# Patient Record
Sex: Female | Born: 1960 | Race: White | Hispanic: No | Smoking: Current every day smoker
Health system: Southern US, Community
[De-identification: ages and names within clinical notes are randomized; demographics above are authoritative.]

## PROBLEM LIST (undated history)

## (undated) DIAGNOSIS — F329 Major depressive disorder, single episode, unspecified: Secondary | ICD-10-CM

## (undated) DIAGNOSIS — A630 Anogenital (venereal) warts: Secondary | ICD-10-CM

## (undated) DIAGNOSIS — G9050 Complex regional pain syndrome I, unspecified: Secondary | ICD-10-CM

## (undated) DIAGNOSIS — F32A Depression, unspecified: Secondary | ICD-10-CM

## (undated) HISTORY — DX: Anogenital (venereal) warts: A63.0

## (undated) HISTORY — DX: Complex regional pain syndrome I, unspecified: G90.50

## (undated) HISTORY — DX: Major depressive disorder, single episode, unspecified: F32.9

## (undated) HISTORY — PX: CATARACT EXTRACTION: SUR2

## (undated) HISTORY — DX: Depression, unspecified: F32.A

---

## 2004-04-17 ENCOUNTER — Ambulatory Visit (HOSPITAL_COMMUNITY): Admission: RE | Admit: 2004-04-17 | Discharge: 2004-04-17 | Payer: Self-pay | Admitting: Family Medicine

## 2004-04-21 ENCOUNTER — Ambulatory Visit (HOSPITAL_COMMUNITY): Admission: RE | Admit: 2004-04-21 | Discharge: 2004-04-21 | Payer: Self-pay | Admitting: Family Medicine

## 2008-02-09 ENCOUNTER — Observation Stay (HOSPITAL_COMMUNITY): Admission: EM | Admit: 2008-02-09 | Discharge: 2008-02-10 | Payer: Self-pay | Admitting: Emergency Medicine

## 2008-02-09 ENCOUNTER — Ambulatory Visit: Payer: Self-pay | Admitting: Internal Medicine

## 2009-04-17 IMAGING — CT CT ANGIO CHEST
1 of 2 series · 20 of 32 positions shown · IV contrast (Omnipaque 300)
Comparison: Chest x-ray from today

CLINICAL DATA: Chest pain

CT ANGIOGRAPHY CHEST
TECHNIQUE: Multidetector CT imaging of the chest using the
standard protocol during bolus administration of intravenous
contrast. Multiplanar reconstructed images obtained and reviewed to
evaluate the vascular anatomy.
Contrast: 80 ml Jmnipaque-J33 IV

[Series 9: thin pacs · axial · 0.70mm/px · z∈[-334,-56]mm · 20 of 310 slices shown]
[im 16/310  lung]
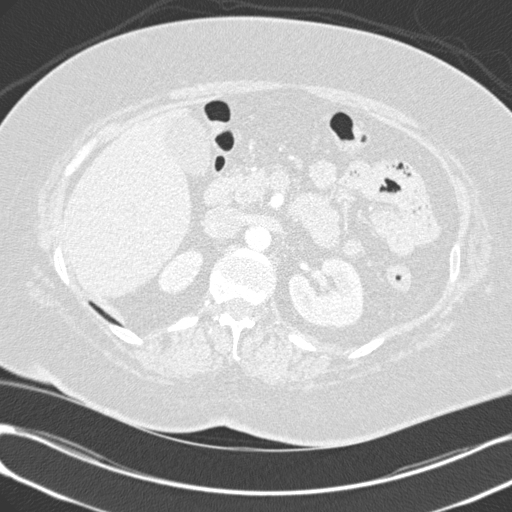
[im 31/310  mediastinal]
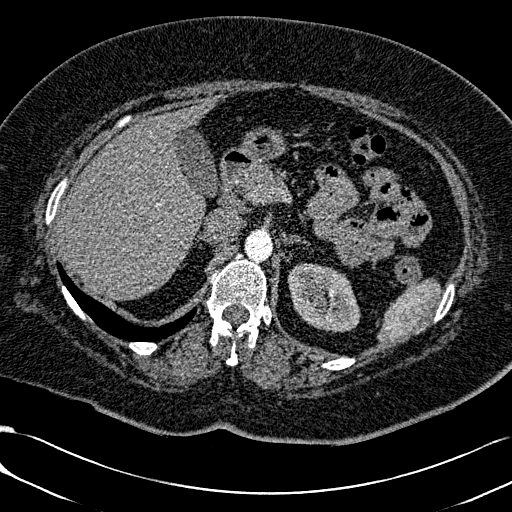
[im 47/310  lung]
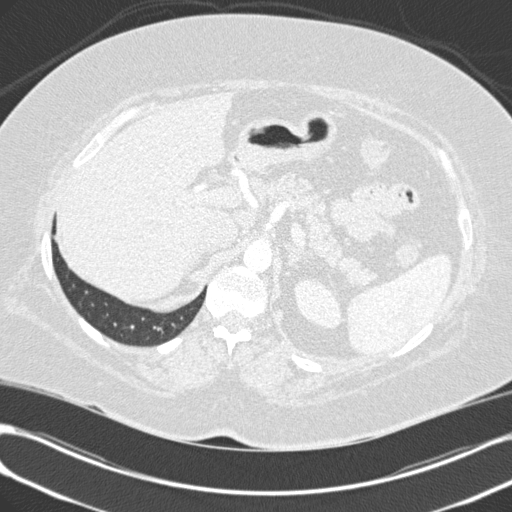
[im 62/310  mediastinal]
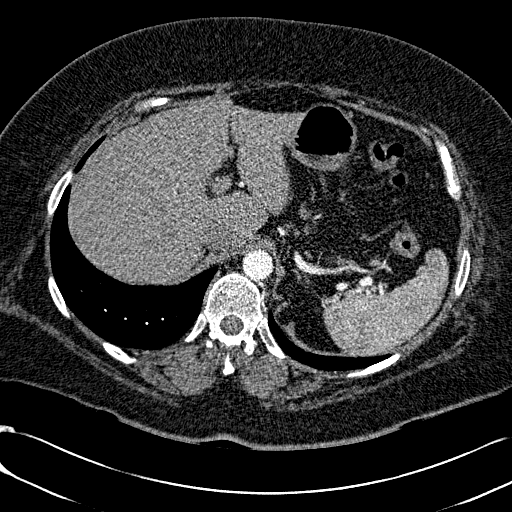
[im 78/310  lung]
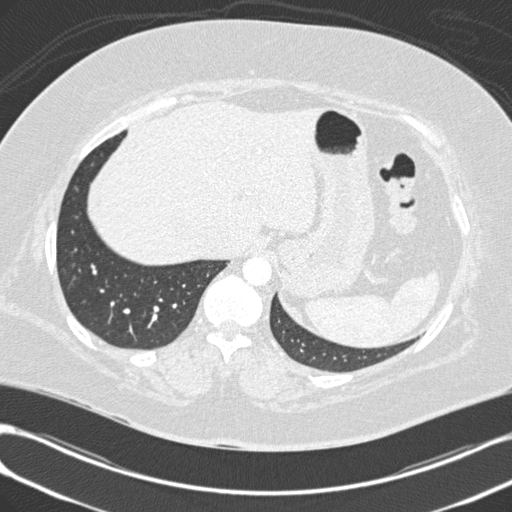
[im 104/310  mediastinal]
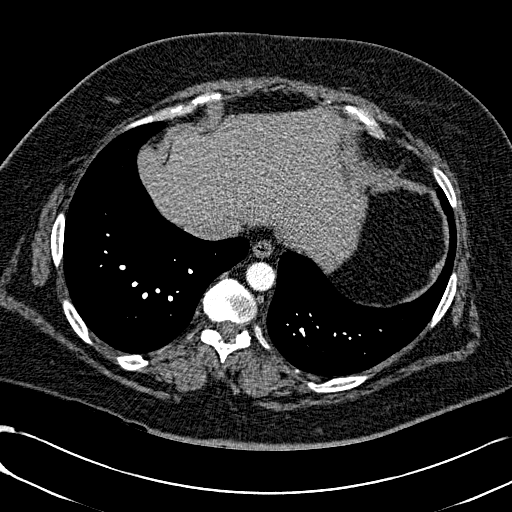
[im 109/310  lung]
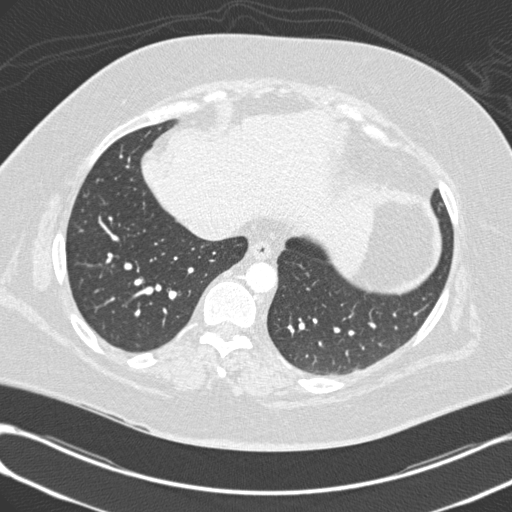
[im 124/310  mediastinal]
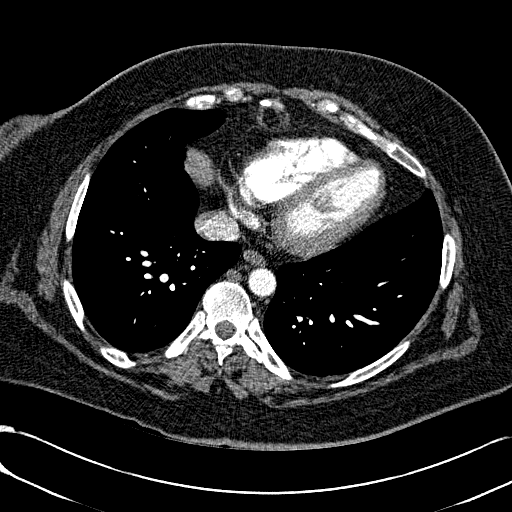
[im 140/310  lung]
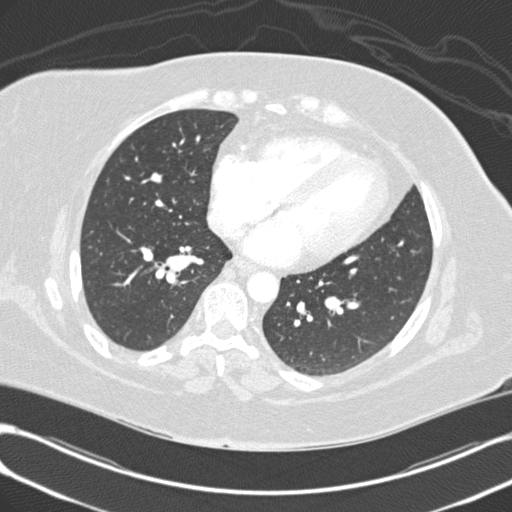
[im 146/310  mediastinal]
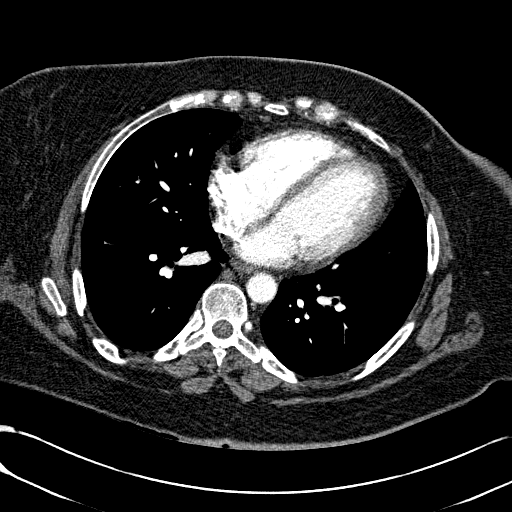
[im 155/310  lung]
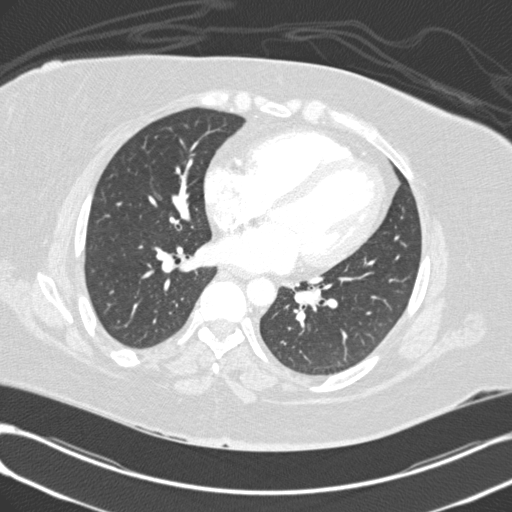
[im 170/310  mediastinal]
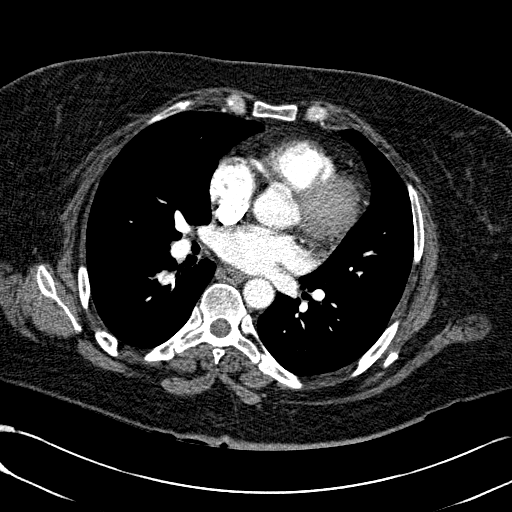
[im 186/310  lung]
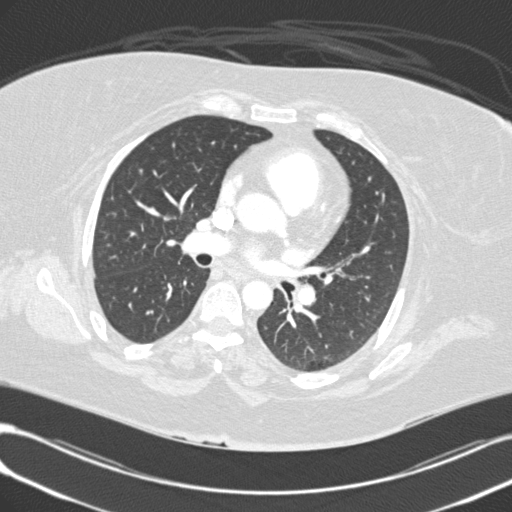
[im 201/310  mediastinal]
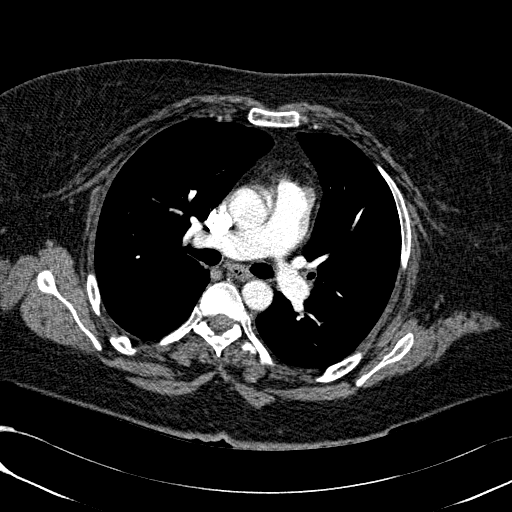
[im 207/310  lung]
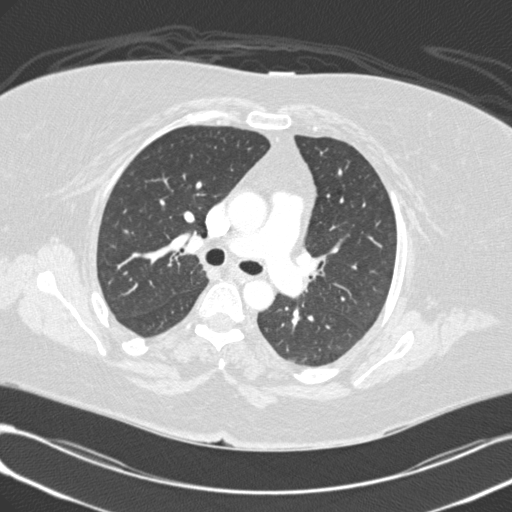
[im 232/310  mediastinal]
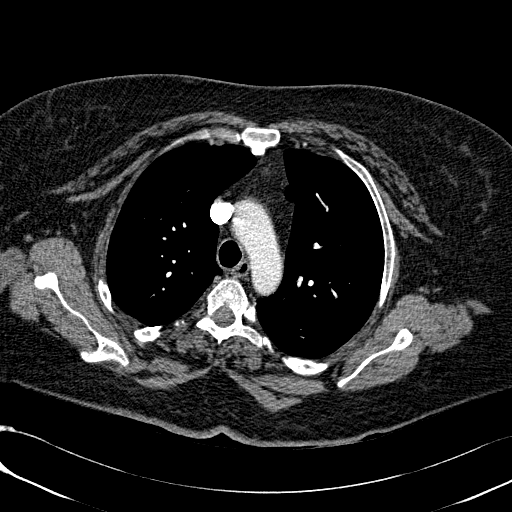
[im 248/310  lung]
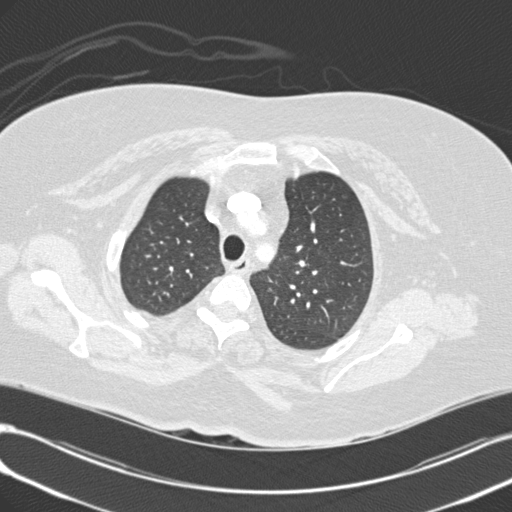
[im 263/310  mediastinal]
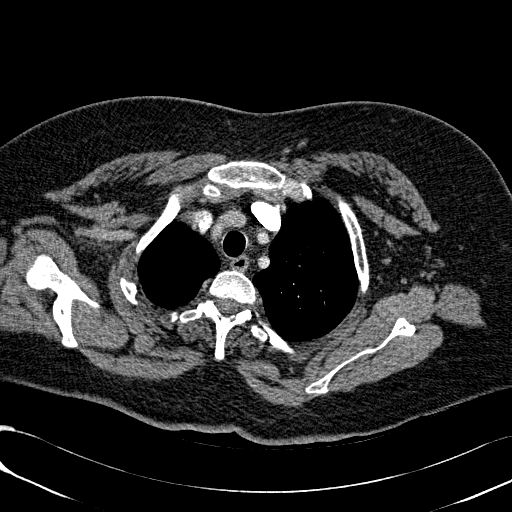
[im 279/310  lung]
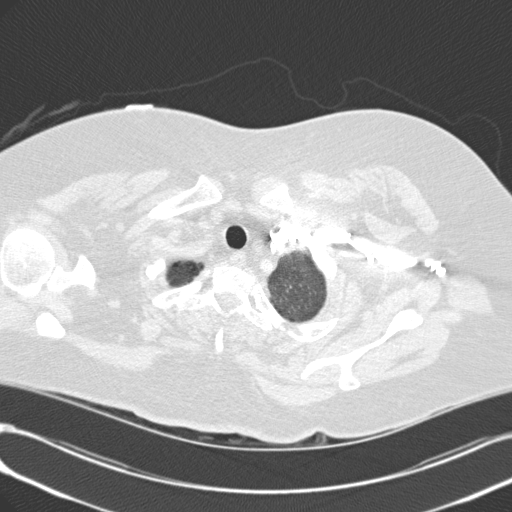
[im 294/310  mediastinal]
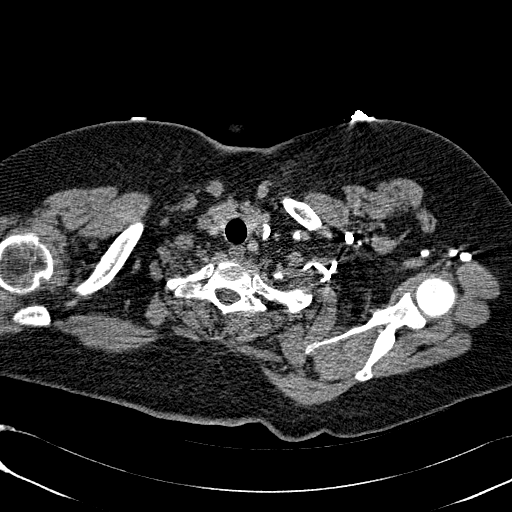

[20 of 32 positions shown; findings below may reference images not displayed]

FINDINGS: Negative for pulmonary embolism.  There is no aortic
dissection or aneurysm.  The lungs are clear there is no
infiltrate, effusion, or mass lesion.  There is no adenopathy.
IMPRESSION: Negative

## 2009-04-17 IMAGING — CR DG CHEST 1V PORT
1 series · 1 of 1 positions shown · non-contrast
Comparison: 04/21/2004

CLINICAL DATA: Chest pain

PORTABLE CHEST - 1 VIEW

[view not recorded]
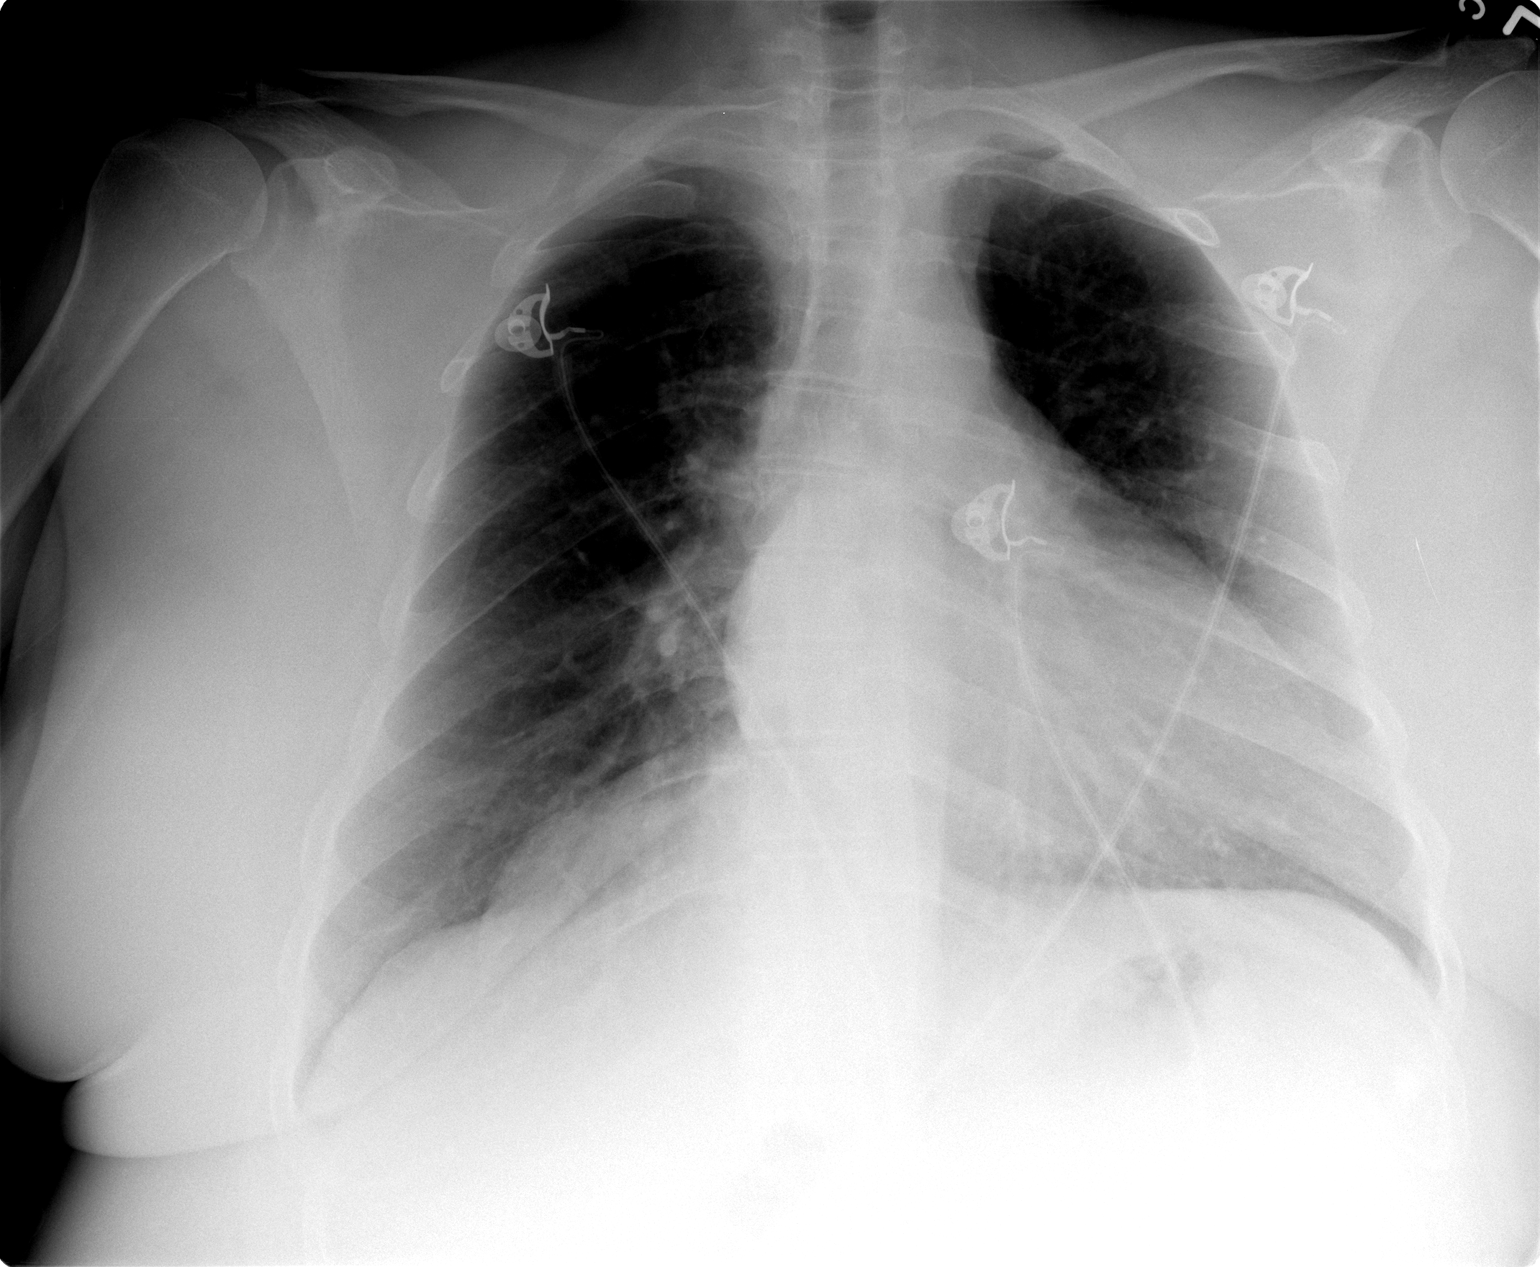

[1 of 1 positions shown; findings below may reference images not displayed]

FINDINGS: The heart is enlarged.  There is no heart failure and the
lungs are clear there is no infiltrate or edema
IMPRESSION: No active cardiopulmonary disease.

## 2011-02-06 NOTE — Group Therapy Note (Signed)
[name], MA               ACCOUNT NO.:  [id number]   MEDICAL RECORD NO.:  [id number]          PATIENT TYPE:  OBV   LOCATION:  A319                          FACILITY:  [facility]   PHYSICIAN:  Angus G. Renard Matter, MD   DATE OF BIRTH:  [birth year]   DATE OF PROCEDURE:  DATE OF DISCHARGE:  02/10/2008                                 PROGRESS NOTE   This patient was admitted to the hospital with chest pain.  She  presented to the ED with acute symptoms of chest pain over lower  anterior chest and in back.  She had workup to include CT angio of the  chest to rule out pulmonary embolization as well as venous Doppler  ultrasound.  Her cardiac markers, most recent CPK total 538, CPK-MB 1.6,  relative index 0.3, and troponin 0.01.  The patient continues to have  some chest pain.  She is being seen by Cardiology as well.   OBJECTIVE:  VITAL SIGNS:  Blood pressure 98/52, respiration 20, pulse  60, and temp 98.2.  LUNGS:  Clear to P&A.  HEART:  Regular rhythm, sinus bradycardia.  ABDOMEN:  No palpable organs or masses.   ASSESSMENT:  The Patient was admitted with anterior chest pain, which is  felt also posteriorly.  She was admitted to rule out coronary artery  disease and still having some pain.   PLAN:  Continue p.r.n. pain medication.  Continue workup to include  amylase, lipase, and CMET.  We will obtain x-ray of thoracic lumbar  spine.      Angus G. Renard Matter, MD  Electronically Signed     AGM/MEDQ  D:  02/10/2008  T:  02/10/2008  Job:  [id number]

## 2011-02-06 NOTE — Consult Note (Signed)
NAMEMORGHAN, Elizabeth Mercado NO.:  [id number]   MEDICAL RECORD NO.:  [id number]          PATIENT TYPE:  INP   LOCATION:  A319                          FACILITY:  [facility]   PHYSICIAN:  Pricilla Riffle, MD, FACCDATE OF BIRTH:  12-23-1960   DATE OF CONSULTATION:  DATE OF DISCHARGE:                                 CONSULTATION   IDENTIFICATION:  The patient is a 50 year old who we are asked to see  regarding chest pain.   HISTORY OF PRESENT ILLNESS:  The patient has no known history of cardiac  problems.  She does have a history of tobacco use since her teens.  Today, she was at her house when she went to the bathroom developed  severe mid chest discomfort like a cramping sensation sharp, lasted a  few seconds and eased.  She said she got short of breath, anxious, went  into the living room still short of breath again tightening, again this  eased.  She has been left with a residual tightness along her midsternal  to the right costophrenic line.   She got concerned and presented to the ER for further evaluation.  Here  she continues to complain of the right-sided chest tightness.  It is  worse with inspiration and movement.  Also notes some radiation to the  back.   Of note, the patient has been noted she was gardening last week had some  leg soreness only.  Denies any other episodes of chest pain.  No  significant shortness of breath until today.   ALLERGIES:  CODEINE.   PAST MEDICAL HISTORY:  Negative.   SOCIAL HISTORY:  The patient is married with children, has a tobacco  history one pack per day since age 33.   FAMILY HISTORY:  Significant for mother with hypertension.  Father with  diabetes.  No known history of premature CAD.   REVIEW OF SYSTEMS:  Denies fevers, chills, no productive cough, notes  occasional indigestion, but no reflux.  Otherwise all systems reviewed  negative to the above problem except as noted above.   PHYSICAL EXAMINATION:  GENERAL:  On  exam, the patient currently is in no  acute distress.  Does note some mild tightness along the right  costophrenic line.  VITAL SIGNS:  Blood pressure 132/74, pulse is 70 and regular,  temperature is 98.7, O2 sat on 2 liters was 100%.  HEENT:  Normocephalic, atraumatic.  EOMI, PERRL.  Mucous membranes are  moist.  NECK:  JVP is normal.  No bruits.  No thyromegaly.  LUNGS:  Clear.  No rales or wheezes.  CARDIAC:  Regular rate and rhythm.  S1, S2.  No S3, S4 murmurs.  CHEST:  Tender along the mid sternal along the right costophrenic line  reproduces the pain.  BACK:  Also tender in the right scapular area.  ABDOMEN:  Supple.  No right upper quadrant tenderness.  Normal bowel  sounds.  No masses.  EXTREMITIES:  Good distal pulses throughout.  No lower extremity  swelling.  2+ pulses.  NEURO:  Alert and oriented x3.  Cranial nerves II through  XII intact.  Moving all extremities.  Motor 5 through 5.   A 12-lead EKG shows normal sinus rhythm.  No acute ST changes.  Rate of  77 beats per minute.   LABORATORY DATA:  Significant for hemoglobin of 14, WBC of 8.1,  platelets of 321,000, BUN and creatinine of 8 and 0.7, and potassium of  3.7.  Initial CK-MB and troponin are negative.  D-dimer slightly  increased to 0.64.  lower extremity ultrasound is negative for DVT.  Chest x-ray portable shows no acute disease.   IMPRESSION:  The patient is a 50 year old with only history significant  for tobacco use.  She says her cholesterol has been good.  I doubt the  current pain is cardiac.  It appears to be more musculoskeletal or  question GI.  I agree with pain med trial.  We will check the CK  troponin, continue telemetry.  Check LFTs, amylase, lipase.  Check  lipids in morning.  Counseled on tobacco.  Based on the above, I would  not pursue further cardiac testing unless situation changes.      Pricilla Riffle, MD, [facility]  Electronically Signed     PVR/MEDQ  D:  02/09/2008  T:  02/10/2008   Job:  [id number]

## 2011-02-06 NOTE — H&P (Signed)
NAMEMYRISSA, CHIPLEY               ACCOUNT NO.:  [id number]   MEDICAL RECORD NO.:  [id number]          PATIENT TYPE:  INP   LOCATION:  A319                          FACILITY:  [facility]   PHYSICIAN:  Angus G. Renard Matter, MD   DATE OF BIRTH:  [birth year]   DATE OF ADMISSION:  02/09/2008  DATE OF DISCHARGE:  LH                              HISTORY & PHYSICAL   HISTORY OF PRESENT ILLNESS:  A 50 year old female developed acutely this  morning chest pain, which she describes sharp pain in upper epigastric  and lower chest area, which radiated to both sides.  This was  accompanied by some shortness of breath persisted over a period of  several hours.  She subsequently was seen by the emergency room  physician.  Evaluation there consisted of portable chest x-ray, which  showed enlargement of her heart, but no active cardiopulmonary disease.  The patient did have also a CT angio of chest, which was negative.  A  venous Doppler ultrasound did not show evidence of DVT.  The patient's  lab studies, total CK was 689 with CK-MB 2.3, troponin 0.01, and  relative index 0.3.  BNP essentially normal.  The patient was admitted  to rule out coronary artery disease.   SOCIAL HISTORY:  The patient drinks occasionally, but does not smoke.   FAMILY HISTORY:  See previous record.   PAST MEDICAL HISTORY:  The patient has not had no particular medical  problems.   PAST SURGICAL HISTORY:  C-section.   ALLERGIES:  No drug allergies except intolerance to CODEINE.   MEDICATIONS:  No medications.   REVIEW OF SYSTEMS:  HEENT:  Negative.  CARDIOPULMONARY:  The patient has  had no cough, some slight dyspnea, chest pain in lower chest.  LUNGS:  No other abnormality.  GI:  No bowel irregularity.  No bleeding.  No  nausea, vomiting, or diarrhea.  GU:  No dysuria or hematuria.   PHYSICAL EXAMINATION:  VITAL SIGNS:  Slightly overweight white female  with blood pressure of 108/54, respirations 20, pulse 54, and  temperature 97.1.  HEENT:  Eyes, PERRLA.  TMs negative.  Oropharynx benign.  NECK:  Supple.  No JVD or thyroid abnormalities.  BREASTS:  No masses.  HEART:  Regular rhythm.  No murmurs.  LUNGS:  Clear to P&A.  Slight tenderness over the lower chest wall.  ABDOMEN:  No palpable organs or masses.  No organomegaly.  EXTREMITIES:  Free of edema.  NEUROLOGIC:  No focal deficits.   ASSESSMENT:  The patient was admitted to the hospital with chest pain to  rule out coronary artery disease and evaluate for other causes.  Pain  was moderately severe.  Plan to run a series of cardiac enzymes, control  the pain, obtain cardiology consult for further evaluation.      Angus G. Renard Matter, MD  Electronically Signed     AGM/MEDQ  D:  02/09/2008  T:  02/10/2008  Job:  [id number]

## 2011-06-20 LAB — CBC
HCT: 40.7
MCHC: 35.3
MCV: 91.4
RBC: 4.45
RDW: 12.6

## 2011-06-20 LAB — DIFFERENTIAL
Basophils Absolute: 0
Basophils Relative: 1
Lymphocytes Relative: 31

## 2011-06-20 LAB — BASIC METABOLIC PANEL
BUN: 8
CO2: 28
Chloride: 106
Creatinine, Ser: 0.74
GFR calc Af Amer: >60
Potassium: 3.7

## 2011-06-20 LAB — COMPREHENSIVE METABOLIC PANEL
ALT: 15
Albumin: 2.9 — ABNORMAL LOW
Alkaline Phosphatase: 45
BUN: 10
CO2: 28
Calcium: 8.2 — ABNORMAL LOW
GFR calc non Af Amer: >60
Potassium: 4.2
Total Protein: 5.1 — ABNORMAL LOW

## 2011-06-20 LAB — CARDIAC PANEL(CRET KIN+CKTOT+MB+TROPI)
CK, MB: 2.3
Relative Index: 0.3
Total CK: 413 — ABNORMAL HIGH
Total CK: 538 — ABNORMAL HIGH
Total CK: 689 — ABNORMAL HIGH
Troponin I: <0.01

## 2011-06-20 LAB — POCT CARDIAC MARKERS
CKMB, poc: 1.4
Troponin i, poc: <0.05

## 2011-06-20 LAB — LIPID PANEL
Cholesterol: 129
HDL: 35 — ABNORMAL LOW
Triglycerides: 77

## 2011-06-20 LAB — D-DIMER, QUANTITATIVE: D-Dimer, Quant: 0.64 — ABNORMAL HIGH

## 2014-08-30 ENCOUNTER — Other Ambulatory Visit (HOSPITAL_COMMUNITY): Payer: Self-pay | Admitting: Family Medicine

## 2014-08-30 ENCOUNTER — Ambulatory Visit (HOSPITAL_COMMUNITY)
Admission: RE | Admit: 2014-08-30 | Discharge: 2014-08-30 | Disposition: A | Discharge status: Home / Self Care | Payer: BC Managed Care – PPO | Source: Ambulatory Visit | Attending: Family Medicine | Admitting: Family Medicine

## 2014-08-30 DIAGNOSIS — R079 Chest pain, unspecified: Secondary | ICD-10-CM

## 2014-08-30 DIAGNOSIS — J984 Other disorders of lung: Secondary | ICD-10-CM | POA: Insufficient documentation

## 2014-08-30 DIAGNOSIS — R05 Cough: Secondary | ICD-10-CM | POA: Insufficient documentation

## 2014-08-31 ENCOUNTER — Other Ambulatory Visit (HOSPITAL_COMMUNITY): Payer: Self-pay | Admitting: Family Medicine

## 2014-08-31 ENCOUNTER — Ambulatory Visit (HOSPITAL_COMMUNITY)
Admission: RE | Admit: 2014-08-31 | Discharge: 2014-08-31 | Disposition: A | Discharge status: Home / Self Care | Payer: BC Managed Care – PPO | Source: Ambulatory Visit | Attending: Family Medicine | Admitting: Family Medicine

## 2014-08-31 DIAGNOSIS — L905 Scar conditions and fibrosis of skin: Secondary | ICD-10-CM

## 2014-08-31 DIAGNOSIS — R079 Chest pain, unspecified: Secondary | ICD-10-CM | POA: Insufficient documentation

## 2014-09-01 ENCOUNTER — Other Ambulatory Visit (HOSPITAL_COMMUNITY): Payer: Self-pay | Admitting: Family Medicine

## 2014-09-01 ENCOUNTER — Ambulatory Visit (HOSPITAL_COMMUNITY)
Admission: RE | Admit: 2014-09-01 | Discharge: 2014-09-01 | Disposition: A | Discharge status: Home / Self Care | Payer: BC Managed Care – PPO | Source: Ambulatory Visit | Attending: Family Medicine | Admitting: Family Medicine

## 2014-09-01 DIAGNOSIS — R079 Chest pain, unspecified: Secondary | ICD-10-CM

## 2014-09-01 DIAGNOSIS — J189 Pneumonia, unspecified organism: Secondary | ICD-10-CM | POA: Diagnosis not present

## 2014-09-01 MED ORDER — IOHEXOL 300 MG/ML  SOLN
80.0000 mL | Freq: Once | INTRAMUSCULAR | Status: AC | PRN
  Administered 2014-09-01: 80 mL via INTRAVENOUS

## 2014-09-01 MED ORDER — SODIUM CHLORIDE 0.9 % IJ SOLN
INTRAMUSCULAR | Status: AC
  Filled 2014-09-01: qty 60

## 2014-09-01 MED ORDER — SODIUM CHLORIDE 0.9 % IJ SOLN
INTRAMUSCULAR | Status: AC
  Filled 2014-09-01: qty 600

## 2014-09-06 ENCOUNTER — Ambulatory Visit (HOSPITAL_COMMUNITY)
Admission: RE | Admit: 2014-09-06 | Discharge: 2014-09-06 | Disposition: A | Discharge status: Home / Self Care | Payer: BC Managed Care – PPO | Source: Ambulatory Visit | Attending: Family Medicine | Admitting: Family Medicine

## 2014-09-06 ENCOUNTER — Other Ambulatory Visit (HOSPITAL_COMMUNITY): Payer: Self-pay | Admitting: Family Medicine

## 2014-09-06 DIAGNOSIS — F17210 Nicotine dependence, cigarettes, uncomplicated: Secondary | ICD-10-CM

## 2014-09-06 DIAGNOSIS — J189 Pneumonia, unspecified organism: Secondary | ICD-10-CM | POA: Insufficient documentation

## 2015-07-12 ENCOUNTER — Other Ambulatory Visit (HOSPITAL_COMMUNITY): Payer: Self-pay | Admitting: Family Medicine

## 2015-07-12 DIAGNOSIS — Z1231 Encounter for screening mammogram for malignant neoplasm of breast: Secondary | ICD-10-CM

## 2015-07-15 ENCOUNTER — Ambulatory Visit (HOSPITAL_COMMUNITY)
Admission: RE | Admit: 2015-07-15 | Discharge: 2015-07-15 | Disposition: A | Discharge status: Home / Self Care | Payer: BC Managed Care – PPO | Source: Ambulatory Visit | Attending: Family Medicine | Admitting: Family Medicine

## 2015-07-15 DIAGNOSIS — Z1231 Encounter for screening mammogram for malignant neoplasm of breast: Secondary | ICD-10-CM

## 2015-07-18 ENCOUNTER — Ambulatory Visit (HOSPITAL_COMMUNITY): Payer: BC Managed Care – PPO

## 2018-11-24 ENCOUNTER — Encounter: Payer: Self-pay | Admitting: Gastroenterology

## 2018-12-24 ENCOUNTER — Other Ambulatory Visit: Payer: Self-pay | Admitting: Women's Health

## 2019-01-29 ENCOUNTER — Ambulatory Visit (INDEPENDENT_AMBULATORY_CARE_PROVIDER_SITE_OTHER): Payer: BC Managed Care – PPO | Admitting: Gastroenterology

## 2019-01-29 ENCOUNTER — Other Ambulatory Visit: Payer: Self-pay

## 2019-01-29 ENCOUNTER — Encounter: Payer: Self-pay | Admitting: Gastroenterology

## 2019-01-29 DIAGNOSIS — Z1211 Encounter for screening for malignant neoplasm of colon: Secondary | ICD-10-CM | POA: Insufficient documentation

## 2019-01-29 NOTE — Patient Instructions (Signed)
We are scheduling you for a colonoscopy with Dr. Oneida Alar in the near future.  Continue to eat high fiber foods, and you can add Benefiber or Metamucil daily to your regimen to ensure you are getting adequate fiber intake. Avoid straining or more than a few minutes on the toilet at a time.   It was a pleasure to see you today. I strive to create trusting relationships with patients to provide genuine, compassionate, and quality care. I value your feedback. If you receive a survey regarding your visit,  I greatly appreciate you taking time to fill this out.   Annitta Needs, PhD, ANP-BC [facility]

## 2019-01-29 NOTE — Progress Notes (Signed)
Referring Provider: Dr. Maudie Mercury  Primary Care Physician:  Jani Gravel, MD  Primary GI: Dr. Thurston Hole Visit via Telephone Note Due to COVID-19, visit is conducted virtually and was requested by patient.   I connected with Elizabeth Mercado on 01/29/19 at  8:30 AM EDT by telephone and verified that I am speaking with the correct person using two identifiers.   I discussed the limitations, risks, security and privacy concerns of performing an evaluation and management service by telephone and the availability of in person appointments. I also discussed with the patient that there may be a patient responsible charge related to this service. The patient expressed understanding and agreed to proceed.  Chief Complaint  Patient presents with  . Colonoscopy    Hemorrhoids     History of Present Illness: 58 year old female with need for initial screening colonoscopy, referred by Dr. Maudie Mercury. Chronic history of hemorrhoids since childbirth, 32 years ago. 2 "stick out" next to vagina. Possible tags. Only with constipation has flares. As long as eating veggies, will have regular BMs. Will use preparation H if needed. Sometimes has to wipe a lot to get clean. Rare scant paper hematochezia with constipation. No abdominal pain. No N/V. No GERD. No dysphagia. Good appetite. Lost about 40 lbs and working with [facility] with phentermine.   Past Medical History:  Diagnosis Date  . CRPS (complex regional pain syndrome type I)   . Depression      Past Surgical History:  Procedure Laterality Date  . CATARACT EXTRACTION    . CESAREAN SECTION       Current Meds  Medication Sig  . DULoxetine (CYMBALTA) 60 MG capsule Take 60 mg by mouth 2 (two) times a day.  . ergocalciferol (VITAMIN D2) 1.25 MG (50000 UT) capsule Take 50,000 Units by mouth once a week.  . gabapentin (NEURONTIN) 100 MG capsule Take 100 mg by mouth 2 (two) times a day.  . phentermine 37.5 MG capsule Take 37.5 mg by mouth every  morning.    Family History  Problem Relation Age of Onset  . Colon polyps Mother        in her 15s, surveillance every 5 years  . Colon cancer Neg Hx     Social History   Socioeconomic History  . Marital status: Divorced    Spouse name: Not on file  . Number of children: Not on file  . Years of education: Not on file  . 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 Financial resource strain: Not on file  . Food insecurity:    Worry: Not on file    Inability: Not on file  . Transportation needs:    Medical: Not on file    Non-medical: Not on file  Tobacco Use  . Smoking status: Current Every Day Smoker    Packs/day: 0.75  . Smokeless tobacco: Never Used  Substance and Sexual Activity  . Alcohol use: Yes    Alcohol/week: 7.0 standard drinks    Types: 7 Standard drinks or equivalent per week    Comment: Mixed drink daily, in the evenings one.   . Drug use: Never  . Sexual activity: Not on file  Lifestyle  . Physical activity:    Days per week: Not on file    Minutes per session: Not on file  . Stress: Not on file  Relationships  . Social  connections:    Talks on phone: Not on file    Gets together: Not on file    Attends religious service: Not on file    Active member of club or organization: Not on file    Attends meetings of clubs or organizations: Not on file    Relationship status: Not on file  . Intimate partner violence:    Fear of current or ex partner: Not on file    Emotionally abused: Not on file    Physically abused: Not on file    Forced sexual activity: Not on file  Other Topics Concern  . Not on file  Social History Narrative  . Not on file      Review of Systems: Gen: see HPI CV: Denies chest pain, palpitations, syncope, peripheral edema, and claudication. Resp: "smoker's cough" GI: see HPI Derm: Denies rash, itching, dry skin Psych: Depression  stable with Celexa  Heme: Denies bruising, bleeding, and enlarged lymph nodes.  Observations/Objective: No distress. Pleasant and cooperative. Maintains eye contact, answers appropriately.   Assessment and Plan: 58 year old female with need for initial screening colonoscopy. From report, appears to have possible internal hemorrhoids and the exterior likely with hemorrhoid tags. No family history of colorectal cancer, but mom with polyps in her 28s, with 5 year surveillances planned.   Proceed with colonoscopy with Dr. Oneida Alar in the near future. The risks, benefits, and alternatives have been discussed in detail with the patient. They state understanding and desire to proceed.  Propofol due to history of ETOH and medications Possible banding if appropriate for internal hemorrhoids   Follow Up Instructions:    I discussed the assessment and treatment plan with the patient. The patient was provided an opportunity to ask questions and all were answered. The patient agreed with the plan and demonstrated an understanding of the instructions.   The patient was advised to call back or seek an in-person evaluation if the symptoms worsen or if the condition fails to improve as anticipated.  I provided minutes of 15 face-to-face time during this encounter.  Annitta Needs, PhD, ANP-BC [facility]

## 2019-01-29 NOTE — Progress Notes (Signed)
CC'D TO PCP °

## 2019-02-09 ENCOUNTER — Other Ambulatory Visit: Payer: BC Managed Care – PPO | Admitting: Women's Health

## 2019-02-23 ENCOUNTER — Other Ambulatory Visit: Payer: Self-pay | Admitting: *Deleted

## 2019-02-23 ENCOUNTER — Telehealth: Payer: Self-pay | Admitting: *Deleted

## 2019-02-23 DIAGNOSIS — Z1211 Encounter for screening for malignant neoplasm of colon: Secondary | ICD-10-CM

## 2019-02-23 DIAGNOSIS — K649 Unspecified hemorrhoids: Secondary | ICD-10-CM

## 2019-02-23 MED ORDER — PEG 3350-KCL-NA BICARB-NACL 420 G PO SOLR: 4000.0000 mL | Freq: Once | ORAL | 0 refills | Status: AC

## 2019-02-23 NOTE — Telephone Encounter (Signed)
Called patient. She is scheduled for TCS +/-hem banding with propofol with [facility] on 8/25 at 1:15pm. Patient aware will send rx to pharmacy. Instructions with pre-op appt will be mailed (confirmed address). Orders entered.

## 2019-02-24 NOTE — Telephone Encounter (Signed)
Pre-op appt mailed with instructions 

## 2019-02-27 ENCOUNTER — Telehealth: Payer: Self-pay | Admitting: *Deleted

## 2019-02-27 NOTE — Telephone Encounter (Signed)
Patient informed we are still not allowing any visitors or children to come in during appointment time unless physical assistance is needed. Asked if has had any exposure to anyone suspected or confirmed of having COVID-19 or if she was experiencing any of the following, to reschedule: fever, cough, shortness of breath, muscle pain, diarrhea, rash, vomiting, abdominal pain, red eye, weakness, bruising, bleeding, joint pain, or a severe headache.  Stated no to all.  Advised to call our office on arrival in our office parking lot to complete registration over the phone. Advised to also use the provided hand sanitizer when entering the office and to wear a mask if she has one, if not, we will provide one. Pt verbalized understanding.

## 2019-03-02 ENCOUNTER — Other Ambulatory Visit: Payer: Self-pay

## 2019-03-02 ENCOUNTER — Encounter: Payer: Self-pay | Admitting: Adult Health

## 2019-03-02 ENCOUNTER — Other Ambulatory Visit (HOSPITAL_COMMUNITY)
Admission: RE | Admit: 2019-03-02 | Discharge: 2019-03-02 | Disposition: A | Discharge status: Home / Self Care | Payer: BC Managed Care – PPO | Source: Ambulatory Visit | Attending: Adult Health | Admitting: Adult Health

## 2019-03-02 ENCOUNTER — Ambulatory Visit (INDEPENDENT_AMBULATORY_CARE_PROVIDER_SITE_OTHER): Payer: BC Managed Care – PPO | Admitting: Adult Health

## 2019-03-02 VITALS — BP 125/74 | HR 74 | Ht 65.0 in | Wt 235.4 lb

## 2019-03-02 DIAGNOSIS — K649 Unspecified hemorrhoids: Secondary | ICD-10-CM | POA: Diagnosis not present

## 2019-03-02 DIAGNOSIS — Z01419 Encounter for gynecological examination (general) (routine) without abnormal findings: Secondary | ICD-10-CM

## 2019-03-02 DIAGNOSIS — Z124 Encounter for screening for malignant neoplasm of cervix: Secondary | ICD-10-CM

## 2019-03-02 DIAGNOSIS — Z1211 Encounter for screening for malignant neoplasm of colon: Secondary | ICD-10-CM | POA: Diagnosis not present

## 2019-03-02 DIAGNOSIS — Z1212 Encounter for screening for malignant neoplasm of rectum: Secondary | ICD-10-CM | POA: Diagnosis not present

## 2019-03-02 LAB — HEMOCCULT GUIAC POC 1CARD (OFFICE): Fecal Occult Blood, POC: NEGATIVE

## 2019-03-02 NOTE — Progress Notes (Signed)
Patient ID: Elizabeth Mercado, female   DOB: 01-02-1961, 58 y.o.   MRN: [id number] History of Present Illness: Shilpa is a 58 year old white female, divorced, PM in for a pap and pelvic exam.She has physical with PCP. PCP is Dr Maudie Mercury.    Current Medications, Allergies, Past Medical History, Past Surgical History, Family History and Social History were reviewed in [employer] record.     Review of Systems: Patient denies any problems with urination, if eats greens every day, no constipation, and is not sexually active in over a year.    Physical Exam:BP 125/74 (BP Location: Left Arm, Patient Position: Sitting, Cuff Size: Normal)   Pulse 74   Ht 5\' 5"  (1.651 m)   Wt 235 lb 6.4 oz (106.8 kg)   BMI 39.17 kg/m  General:  Well developed, well nourished, no acute distress Skin:  Warm and dry Lungs; Clear to auscultation bilaterally Cardiovascular: Regular rate and rhythm Pelvic:  External genitalia is normal in appearance, no lesions.  The vagina is pale with loss of color, moisture and rugae. Urethra has no lesions or masses. The cervix is smooth,pap with HPV performed.  Uterus is felt to be normal size, shape, and contour.  No adnexal masses or tenderness noted.Bladder is non tender, no masses felt. Rectal: Good sphincter tone, no polyps, +internal and external hemorrhoids felt.  Hemoccult negative. Psych:  No mood changes, alert and cooperative,seems happy Fall risk is low. PHQ 2 score 0. Examination chaperoned by Estill Bamberg Rash LPN.  Impression: 1. Encounter for gynecological examination with Papanicolaou smear of cervix   2. Screening for colorectal cancer   3. Hemorrhoids, unspecified hemorrhoid type       Plan: Pap with HPV sent Pap in 3 years Physical and labs with PCP  Had mammogram in October at [facility] mobile unit, was normal she says Colonoscopy scheduled for august 15 with Dr Oneida Alar

## 2019-03-05 LAB — CYTOLOGY - PAP
Adequacy: ABSENT
Diagnosis: NEGATIVE
HPV: NOT DETECTED

## 2019-05-14 ENCOUNTER — Telehealth: Payer: Self-pay | Admitting: *Deleted

## 2019-05-14 ENCOUNTER — Other Ambulatory Visit: Payer: Self-pay

## 2019-05-14 ENCOUNTER — Encounter (HOSPITAL_COMMUNITY): Payer: Self-pay

## 2019-05-14 NOTE — Telephone Encounter (Signed)
Called patient. She did not want to move up procedure time on 05/19/2019. Called endo and made aware

## 2019-05-15 ENCOUNTER — Encounter (HOSPITAL_COMMUNITY)
Admission: RE | Admit: 2019-05-15 | Discharge: 2019-05-15 | Disposition: A | Discharge status: Home / Self Care | Payer: BC Managed Care – PPO | Source: Ambulatory Visit | Attending: Gastroenterology | Admitting: Gastroenterology

## 2019-05-15 ENCOUNTER — Other Ambulatory Visit: Payer: Self-pay

## 2019-05-15 ENCOUNTER — Other Ambulatory Visit (HOSPITAL_COMMUNITY)
Admission: RE | Admit: 2019-05-15 | Discharge: 2019-05-15 | Disposition: A | Discharge status: Home / Self Care | Payer: BC Managed Care – PPO | Source: Ambulatory Visit | Attending: Gastroenterology | Admitting: Gastroenterology

## 2019-05-15 DIAGNOSIS — Z20828 Contact with and (suspected) exposure to other viral communicable diseases: Secondary | ICD-10-CM | POA: Insufficient documentation

## 2019-05-15 DIAGNOSIS — Z01812 Encounter for preprocedural laboratory examination: Secondary | ICD-10-CM | POA: Diagnosis not present

## 2019-05-15 LAB — SARS CORONAVIRUS 2 (TAT 6-24 HRS): SARS Coronavirus 2: NEGATIVE

## 2019-05-19 ENCOUNTER — Encounter (HOSPITAL_COMMUNITY): Payer: Self-pay | Admitting: *Deleted

## 2019-05-19 ENCOUNTER — Ambulatory Visit (HOSPITAL_COMMUNITY): Payer: BC Managed Care – PPO | Admitting: Anesthesiology

## 2019-05-19 ENCOUNTER — Encounter (HOSPITAL_COMMUNITY)
Admission: RE | Disposition: A | Discharge status: Home / Self Care | Payer: Self-pay | Source: Home / Self Care | Attending: Gastroenterology

## 2019-05-19 ENCOUNTER — Ambulatory Visit (HOSPITAL_COMMUNITY)
Admission: RE | Admit: 2019-05-19 | Discharge: 2019-05-19 | Disposition: A | Discharge status: Home / Self Care | Payer: BC Managed Care – PPO | Attending: Gastroenterology | Admitting: Gastroenterology

## 2019-05-19 ENCOUNTER — Other Ambulatory Visit: Payer: Self-pay

## 2019-05-19 DIAGNOSIS — Z79899 Other long term (current) drug therapy: Secondary | ICD-10-CM | POA: Diagnosis not present

## 2019-05-19 DIAGNOSIS — Z1211 Encounter for screening for malignant neoplasm of colon: Secondary | ICD-10-CM | POA: Diagnosis not present

## 2019-05-19 DIAGNOSIS — K635 Polyp of colon: Secondary | ICD-10-CM | POA: Diagnosis not present

## 2019-05-19 DIAGNOSIS — D123 Benign neoplasm of transverse colon: Secondary | ICD-10-CM | POA: Diagnosis not present

## 2019-05-19 DIAGNOSIS — K648 Other hemorrhoids: Secondary | ICD-10-CM | POA: Diagnosis not present

## 2019-05-19 DIAGNOSIS — F329 Major depressive disorder, single episode, unspecified: Secondary | ICD-10-CM | POA: Diagnosis not present

## 2019-05-19 DIAGNOSIS — K649 Unspecified hemorrhoids: Secondary | ICD-10-CM

## 2019-05-19 DIAGNOSIS — Z885 Allergy status to narcotic agent status: Secondary | ICD-10-CM | POA: Insufficient documentation

## 2019-05-19 DIAGNOSIS — K5730 Diverticulosis of large intestine without perforation or abscess without bleeding: Secondary | ICD-10-CM | POA: Diagnosis not present

## 2019-05-19 DIAGNOSIS — F17210 Nicotine dependence, cigarettes, uncomplicated: Secondary | ICD-10-CM | POA: Diagnosis not present

## 2019-05-19 DIAGNOSIS — G9050 Complex regional pain syndrome I, unspecified: Secondary | ICD-10-CM | POA: Insufficient documentation

## 2019-05-19 DIAGNOSIS — K644 Residual hemorrhoidal skin tags: Secondary | ICD-10-CM | POA: Diagnosis not present

## 2019-05-19 HISTORY — PX: COLONOSCOPY WITH PROPOFOL: SHX5780

## 2019-05-19 HISTORY — PX: POLYPECTOMY: SHX5525

## 2019-05-19 SURGERY — COLONOSCOPY WITH PROPOFOL
Anesthesia: General

## 2019-05-19 MED ORDER — LACTATED RINGERS IV SOLN
Freq: Once | INTRAVENOUS | Status: AC
  Administered 2019-05-19: 12:00:00 via INTRAVENOUS

## 2019-05-19 MED ORDER — ONDANSETRON HCL 4 MG/2ML IJ SOLN: 4.0000 mg | Freq: Once | INTRAMUSCULAR | Status: DC | PRN

## 2019-05-19 MED ORDER — LACTATED RINGERS IV SOLN
INTRAVENOUS | Status: DC | PRN
  Administered 2019-05-19: 12:00:00 via INTRAVENOUS

## 2019-05-19 MED ORDER — CHLORHEXIDINE GLUCONATE CLOTH 2 % EX PADS: 6.0000 | MEDICATED_PAD | Freq: Once | CUTANEOUS | Status: DC

## 2019-05-19 MED ORDER — PROPOFOL 10 MG/ML IV BOLUS
INTRAVENOUS | Status: DC | PRN
  Administered 2019-05-19 (×3): 20 mg via INTRAVENOUS

## 2019-05-19 MED ORDER — PROPOFOL 500 MG/50ML IV EMUL
INTRAVENOUS | Status: DC | PRN
  Administered 2019-05-19: 13:00:00 via INTRAVENOUS
  Administered 2019-05-19: 150 ug/kg/min via INTRAVENOUS

## 2019-05-19 MED ORDER — PROPOFOL 10 MG/ML IV BOLUS
INTRAVENOUS | Status: AC
  Filled 2019-05-19: qty 40

## 2019-05-19 MED ORDER — GLYCOPYRROLATE 0.2 MG/ML IJ SOLN
INTRAMUSCULAR | Status: DC | PRN
  Administered 2019-05-19: 0.2 mg via INTRAVENOUS

## 2019-05-19 MED ORDER — LIDOCAINE HCL (CARDIAC) PF 100 MG/5ML IV SOSY
PREFILLED_SYRINGE | INTRAVENOUS | Status: DC | PRN
  Administered 2019-05-19: 40 mg via INTRAVENOUS

## 2019-05-19 NOTE — H&P (Signed)
Primary Care Physician:  Jani Gravel, MD Primary Gastroenterologist:  Dr. Oneida Alar  Pre-Procedure History & Physical: HPI:  Elizabeth Mercado is a 58 y.o. female here for [place].  Past Medical History:  Diagnosis Date  . CRPS (complex regional pain syndrome type I)   . Depression   . Warts, genital     Past Surgical History:  Procedure Laterality Date  . CATARACT EXTRACTION    . CESAREAN SECTION      Prior to Admission medications   Medication Sig Start Date End Date Taking? Authorizing Provider  DULoxetine (CYMBALTA) 60 MG capsule Take 60 mg by mouth 2 (two) times daily.    Yes [provider]  gabapentin (NEURONTIN) 600 MG tablet Take 600 mg by mouth 2 (two) times daily.    Yes [provider]  ibuprofen (ADVIL) 200 MG tablet Take 400 mg by mouth every 6 (six) hours as needed for headache or moderate pain.   Yes [provider]    Allergies as of 02/23/2019 - Review Complete 01/29/2019  Allergen Reaction Noted  . Codeine Nausea Only 11/07/2017    Family History  Problem Relation Age of Onset  . Colon polyps Mother        in her 37s, surveillance every 5 years  . Breast cancer Mother   . Cancer Mother   . Diabetes Father   . Cancer Brother     Social History   Socioeconomic History  . Marital status: Divorced    Spouse name: Not on file  . Number of children: Not on file  . Years of education: Not on file  . 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 Financial resource strain: Not on file  . Food insecurity    Worry: Not on file    Inability: Not on file  . Transportation needs    Medical: Not on file    Non-medical: Not on file  Tobacco Use  . Smoking status: Current Every Day Smoker    Packs/day: 0.75  . Smokeless tobacco: Never Used  Substance and Sexual Activity  . Alcohol use: Yes    Alcohol/week: 7.0 standard  drinks    Types: 7 Standard drinks or equivalent per week    Comment: Mixed drink daily, in the evenings one.   . Drug use: Never  . Sexual activity: Not Currently    Birth control/protection: None, Post-menopausal  Lifestyle  . Physical activity    Days per week: Not on file    Minutes per session: Not on file  . Stress: Not on file  Relationships  . Social Herbalist on phone: Not on file    Gets together: Not on file    Attends religious service: Not on file    Active member of club or organization: Not on file    Attends meetings of clubs or organizations: Not on file    Relationship status: Not on file  . Intimate partner violence    Fear of current or ex partner: Not on file    Emotionally abused: Not on file    Physically abused: Not on file    Forced sexual activity: Not on file  Other Topics Concern  . Not on file  Social History Narrative  . Not on file    Review of Systems: See HPI, otherwise negative ROS   Physical Exam: BP 122/78  Pulse 71   Temp 98.2 F (36.8 C) (Oral)   Resp (!) 24   Ht 5\' 5"  (1.651 m)   Wt 106.8 kg   SpO2 97%   BMI 39.18 kg/m  General:   Alert,  pleasant and cooperative in NAD Head:  Normocephalic and atraumatic. Neck:  Supple; Lungs:  Clear throughout to auscultation.    Heart:  Regular rate and rhythm. Abdomen:  Soft, nontender and nondistended. Normal bowel sounds, without guarding, and without rebound.   Neurologic:  Alert and  oriented x4;  grossly normal neurologically.  Impression/Plan:    SCREENING  Plan:  1. TCS TODAY DISCUSSED PROCEDURE, BENEFITS, & RISKS: < 1% chance of medication reaction, bleeding, perforation, ASPIRATION, or rupture of spleen/liver requiring surgery to fix it and missed polyps < 1 cm 10-20% of the time.

## 2019-05-19 NOTE — Op Note (Signed)
[facility] ([facility] Patient Name: Elizabeth Mercado Procedure Date: 05/19/2019 12:13 PM MRN: [id number] Date of Birth: 07/03/1961 Attending MD: Barney Drain MD, MD CSN: [id number] Age: 58 Admit Type: Outpatient Procedure:                Colonoscopy WITH COLD SNARE POLYPECTOMY Indications:              Screening for colorectal malignant neoplasm Providers:                Barney Drain MD, MD, Janeece Riggers, RN, Randa Spike, Technician Referring MD:             Teodora Medici. Kim Medicines:                Propofol per Anesthesia Complications:            No immediate complications. Estimated Blood Loss:     Estimated blood loss was minimal. Procedure:                Pre-Anesthesia Assessment:                           - Prior to the procedure, a History and Physical                            was performed, and patient medications and                            allergies were reviewed. The patient's tolerance of                            previous anesthesia was also reviewed. The risks                            and benefits of the procedure and the sedation                            options and risks were discussed with the patient.                            All questions were answered, and informed consent                            was obtained. Prior Anticoagulants: The patient has                            taken no previous anticoagulant or antiplatelet                            agents except for NSAID medication. ASA Grade                            Assessment: II - A patient with mild systemic  disease. After reviewing the risks and benefits,                            the patient was deemed in satisfactory condition to                            undergo the procedure. After obtaining informed                            consent, the colonoscope was passed under direct                            vision. Throughout the procedure, the  patient's                            blood pressure, pulse, and oxygen saturations were                            monitored continuously. The [id number]-[id number]                            scope was introduced through the anus and advanced                            to the the cecum, identified by appendiceal orifice                            and ileocecal valve. The colonoscopy was somewhat                            difficult due to a tortuous colon. Successful                            completion of the procedure was aided by                            straightening and shortening the scope to obtain                            bowel loop reduction and COLOWRAP. The patient                            tolerated the procedure. The quality of the bowel                            preparation was excellent. The ileocecal valve,                            appendiceal orifice, and rectum were photographed. Scope In: 12:44:26 PM Scope Out: 1:10:03 PM Scope Withdrawal Time: 0 hours 20 minutes 55 seconds  Total Procedure Duration: 0 hours 25 minutes 37 seconds  Findings:      Five sessile polyps were found in the proximal transverse colon, mid  transverse colon and hepatic flexure. The polyps were 2 to 4 mm in size.       These polyps were removed with a cold snare. Resection and retrieval       were complete.      Multiple small and large-mouthed diverticula were found in the       transverse colon, hepatic flexure and ascending colon.      Bleeding internal hemorrhoids were found during retroflexion. The       hemorrhoids were small.      External hemorrhoids were found during perianal exam. The hemorrhoids       were large.      The recto-sigmoid colon, sigmoid colon, descending colon and splenic       flexure were moderately tortuous. Impression:               - Five 2 to 4 mm polyps in the proximal transverse                            colon(1), in the mid transverse colon(2) and  at the                            hepatic flexure(2), removed with a cold snare.                            Resected and retrieved.                           - MILD Diverticulosis in the transverse colon, at                            the hepatic flexure and in the ascending colon.                           - Bleeding internal hemorrhoids.                           - External hemorrhoids.                           - MODERTAELY Tortuous LEFT colon. Moderate Sedation:      Per Anesthesia Care      Per Anesthesia Care Recommendation:           - Patient has a contact number available for                            emergencies. The signs and symptoms of potential                            delayed complications were discussed with the                            patient. Return to normal activities tomorrow.                            Written discharge instructions were provided to the  patient.                           - High fiber diet. LOSE WIGHT TO BMI < 30 FOLLOWING                            RECOMMENDATIONS OF "THE TEN DAY DETOX DIET".                           - Continue present medications.                           - Await pathology results.                           - Repeat colonoscopy in 3 years for surveillance.                           - Return to GI office in 6 months. Procedure Code(s):        --- Professional ---                           [phone], Colonoscopy, flexible; with removal of                            tumor(s), polyp(s), or other lesion(s) by snare                            technique Diagnosis Code(s):        --- Professional ---                           Z12.11, Encounter for screening for malignant                            neoplasm of colon                           K63.5, Polyp of colon                           K64.4, Residual hemorrhoidal skin tags                           K64.8, Other hemorrhoids                            K57.30, Diverticulosis of large intestine without                            perforation or abscess without bleeding                           Q43.8, Other specified congenital malformations of                            intestine CPT copyright 2019 [employer]. All rights reserved. The codes documented in this report are preliminary and upon coder review may  be revised to meet current compliance requirements. Barney Drain, MD Barney Drain MD, MD 05/19/2019 1:31:02 PM This report has been signed electronically. Number of Addenda: 0

## 2019-05-19 NOTE — Discharge Instructions (Signed)
You have small internal hemorrhoids AND LARGE EXTERNAL HEMORRHOIDS. YOU HAVE diverticulosis IN YOUR LEFT AND RIGHT COLON. YOU HAD  FIVE POLYPS REMOVED.   EAT TO LIVE AND THINK OF FOOD AS MEDICINE.  DRINK WATER TO KEEP YOUR URINE LIGHT YELLOW.  To have more energy and to lose weight:      1. CONTINUE YOUR WEIGHT LOSS EFFORTS. I RECOMMEND YOU READ AND FOLLOW RECOMMENDATIONS BY DR. MARK HYMAN, "10-DAY DETOX DIET". OTHERWISE,FOLLOW A HIGH FIBER DIET. AVOID ITEMS THAT CAUSE BLOATING. See info below.     2. If you must eat bread, EAT EZEKIEL BREAD. IT IS IN THE FROZEN SECTION OF THE GROCERY STORE.    3. Do not drink SODA, GATORADE, ENERGY DRINKS, OR DIET SODA.     4. AVOID HIGH FRUCTOSE CORN SYRUP.     5. DO NOT chew SUGAR FREE GUM OR USE ARTIFICIAL SWEETENERS. USE STEVIA AS A SWEETENER.    6. DO NOT EAT ENRICHED WHEAT FLOUR, PASTA, RICE, OR CEREAL.    7. DO NOT EAT FARM RAISED FISH. ONLY EAT WILD CAUGHT SEAFOOD, GRASS FED BEEF OR CHICKEN, PASTURE RAISED PORK, OR EGGS FROM PASTURE RAISED CHICKENS.    8. PRACTICE CHAIR YOGA FOR 15-30 MINS 3 OR 4 TIMES A WEEK AND PROGRESS TO HATHA YOGA OVER NEXT 6 MOS.    9. START TAKING A  MULTIVITAMIN, VITAMIN B12, AND VITAMIN D3 2000 IU DAILY.    USE PREPARATION H FOUR TIMES  A DAY IF NEEDED TO RELIEVE RECTAL PAIN/PRESSURE/BLEEDING.   SEE SURGERY TO FIX YOUR HEMORRHOIDS.  YOUR BIOPSY RESULTS WILL BE BACK IN 5 BUSINESS DAYS.  FOLLOW UP IN 6 MOS.   Next colonoscopy in 3 years.  Colonoscopy Care After Read the instructions outlined below and refer to this sheet in the next week. These discharge instructions provide you with general information on caring for yourself after you leave the hospital. While your treatment has been planned according to the most current medical practices available, unavoidable complications occasionally occur. If you have any problems or questions after discharge, call DR. Shamecka Hocutt, [phone].  ACTIVITY  You may resume  your regular activity, but move at a slower pace for the next 24 hours.   Take frequent rest periods for the next 24 hours.   Walking will help get rid of the air and reduce the bloated feeling in your belly (abdomen).   No driving for 24 hours (because of the medicine (anesthesia) used during the test).   You may shower.   Do not sign any important legal documents or operate any machinery for 24 hours (because of the anesthesia used during the test).    NUTRITION  Drink plenty of fluids.   You may resume your normal diet as instructed by your doctor.   Begin with a light meal and progress to your normal diet. Heavy or fried foods are harder to digest and may make you feel sick to your stomach (nauseated).   Avoid alcoholic beverages for 24 hours or as instructed.    MEDICATIONS  You may resume your normal medications.   WHAT YOU CAN EXPECT TODAY  Some feelings of bloating in the abdomen.   Passage of more gas than usual.   Spotting of blood in your stool or on the toilet paper  .  IF YOU HAD POLYPS REMOVED DURING THE COLONOSCOPY:  Eat a soft diet IF YOU HAVE NAUSEA, BLOATING, ABDOMINAL PAIN, OR VOMITING.    FINDING OUT THE RESULTS OF YOUR TEST  Not all test results are available during your visit. DR. Oneida Alar WILL CALL YOU WITHIN 14 DAYS OF YOUR PROCEDUE WITH YOUR RESULTS. Do not assume everything is normal if you have not heard from DR. Zaquan Duffner, CALL HER OFFICE AT [phone].  SEEK IMMEDIATE MEDICAL ATTENTION AND CALL THE OFFICE: [phone] IF:  You have more than a spotting of blood in your stool.   Your belly is swollen (abdominal distention).   You are nauseated or vomiting.   You have a temperature over 101F.   You have abdominal pain or discomfort that is severe or gets worse throughout the day.  High-Fiber Diet A high-fiber diet changes your normal diet to include more whole grains, legumes, fruits, and vegetables. Changes in the diet involve  replacing refined carbohydrates with unrefined foods. The calorie level of the diet is essentially unchanged. The Dietary Reference Intake (recommended amount) for adult males is 38 grams per day. For adult females, it is 25 grams per day. Pregnant and lactating women should consume 28 grams of fiber per day. Fiber is the intact part of a plant that is not broken down during digestion. Functional fiber is fiber that has been isolated from the plant to provide a beneficial effect in the body.  PURPOSE  Increase stool bulk.   Ease and regulate bowel movements.   Lower cholesterol.   REDUCE RISK OF COLON CANCER  INDICATIONS THAT YOU NEED MORE FIBER  Constipation and hemorrhoids.   Uncomplicated diverticulosis (intestine condition) and irritable bowel syndrome.   Weight management.   As a protective measure against hardening of the arteries (atherosclerosis), diabetes, and cancer.   GUIDELINES FOR INCREASING FIBER IN THE DIET  Start adding fiber to the diet slowly. A gradual increase of about 5 more grams (2 servings of most fruits or vegetables) per day is best. Too rapid an increase in fiber may result in constipation, flatulence, and bloating.   Drink enough water and fluids to keep your urine clear or pale yellow. Water, juice, or caffeine-free drinks are recommended. Not drinking enough fluid may cause constipation.   Eat a variety of high-fiber foods rather than one type of fiber.   Try to increase your intake of fiber through using high-fiber foods rather than fiber pills or supplements that contain small amounts of fiber.   The goal is to change the types of food eaten. Do not supplement your present diet with high-fiber foods, but replace foods in your present diet.    Polyps, Colon  A polyp is extra tissue that grows inside your body. Colon polyps grow in the large intestine. The large intestine, also called the colon, is part of your digestive system. It is a long, hollow  tube at the end of your digestive tract where your body makes and stores stool. Most polyps are not dangerous. They are benign. This means they are not cancerous. But over time, some types of polyps can turn into cancer. Polyps that are smaller than a pea are usually not harmful. But larger polyps could someday become or may already be cancerous. To be safe, doctors remove all polyps and test them.   PREVENTION There is not one sure way to prevent polyps. You might be able to lower your risk of getting them if you:  Eat more fruits and vegetables and less fatty food.   Do not smoke.   Avoid alcohol.   Exercise every day.   Lose weight if you are overweight.   Eating more calcium and  folate can also lower your risk of getting polyps. Some foods that are rich in calcium are milk, cheese, and broccoli. Some foods that are rich in folate are chickpeas, kidney beans, and spinach.    Diverticulosis Diverticulosis is a common condition that develops when small pouches (diverticula) form in the wall of the colon. The risk of diverticulosis increases with age. It happens more often in people who eat a low-fiber diet. Most individuals with diverticulosis have no symptoms. Those individuals with symptoms usually experience belly (abdominal) pain, constipation, or loose stools (diarrhea).  HOME CARE INSTRUCTIONS  Increase the amount of fiber in your diet as directed by your caregiver or dietician. This may reduce symptoms of diverticulosis.   Drink at least 6 to 8 glasses of water each day to prevent constipation.   Try not to strain when you have a bowel movement.   Avoiding nuts and seeds to prevent complications is NOT NECESSARY.   FOODS HAVING HIGH FIBER CONTENT INCLUDE:  Fruits. Apple, peach, pear, tangerine, raisins, prunes.   Vegetables. Brussels sprouts, asparagus, broccoli, cabbage, carrot, cauliflower, romaine lettuce, spinach, summer squash, tomato, winter squash, zucchini.    Starchy Vegetables. Baked beans, kidney beans, lima beans, split peas, lentils, potatoes (with skin).   Grains. Whole wheat bread, brown rice, bran flake cereal, plain oatmeal, white rice, shredded wheat, bran muffins.   SEEK IMMEDIATE MEDICAL CARE IF:  You develop increasing pain or severe bloating.   You have an oral temperature above 101F.   You develop vomiting or bowel movements that are bloody or black.   Hemorrhoids Hemorrhoids are dilated (enlarged) veins around the rectum. Sometimes clots will form in the veins. This makes them swollen and painful. These are called thrombosed hemorrhoids. Causes of hemorrhoids include:  Constipation.   Straining to have a bowel movement.   HEAVY LIFTING   HOME CARE INSTRUCTIONS  Eat a well balanced diet and drink 6 to 8 glasses of water every day to avoid constipation. You may also use a bulk laxative.   Avoid straining to have bowel movements.   Keep anal area dry and clean.   Do not use a donut shaped pillow or sit on the toilet for long periods. This increases blood pooling and pain.   Move your bowels when your body has the urge; this will require less straining and will decrease pain and pressure.

## 2019-05-19 NOTE — Anesthesia Preprocedure Evaluation (Signed)
Anesthesia Evaluation  Patient identified by MRN, date of birth, ID band Patient awake    Reviewed: Allergy & Precautions, NPO status , Patient's Chart, lab work & pertinent test results  Airway Mallampati: II  TM Distance: >3 FB Neck ROM: Full    Dental no notable dental hx.    Pulmonary Current Smoker,    Pulmonary exam normal breath sounds clear to auscultation       Cardiovascular Normal cardiovascular exam Rhythm:Regular Rate:Normal     Neuro/Psych PSYCHIATRIC DISORDERS Depression  Neuromuscular disease (CRPS)    GI/Hepatic negative GI ROS, Neg liver ROS,   Endo/Other  negative endocrine ROS  Renal/GU negative Renal ROS     Musculoskeletal negative musculoskeletal ROS (+) CRPS   Abdominal   Peds  Hematology negative hematology ROS (+)   Anesthesia Other Findings   Reproductive/Obstetrics negative OB ROS                             Anesthesia Physical Anesthesia Plan  ASA: II  Anesthesia Plan: General   Post-op Pain Management:    Induction:   PONV Risk Score and Plan:   Airway Management Planned: Nasal Cannula, Simple Face Mask and Natural Airway  Additional Equipment:   Intra-op Plan:   Post-operative Plan:   Informed Consent: I have reviewed the patients History and Physical, chart, labs and discussed the procedure including the risks, benefits and alternatives for the proposed anesthesia with the patient or authorized representative who has indicated his/her understanding and acceptance.     Dental advisory given  Plan Discussed with: CRNA  Anesthesia Plan Comments:         Anesthesia Quick Evaluation

## 2019-05-19 NOTE — Anesthesia Postprocedure Evaluation (Signed)
Anesthesia Post Note  Patient: Elizabeth Mercado  Procedure(s) Performed: COLONOSCOPY WITH PROPOFOL (N/A ) POLYPECTOMY  Patient location during evaluation: PACU Anesthesia Type: General Level of consciousness: awake and alert and oriented Pain management: pain level controlled Vital Signs Assessment: post-procedure vital signs reviewed and stable Respiratory status: spontaneous breathing Cardiovascular status: stable Postop Assessment: no apparent nausea or vomiting Anesthetic complications: no     Last Vitals:  Vitals:   05/19/19 1204 05/19/19 1315  BP: 122/78 110/70  Pulse: 71   Resp: (!) 24 17  Temp: 36.8 C (P) 36.5 C  SpO2: 97%     Last Pain:  Vitals:   05/19/19 1204  TempSrc: Oral  PainSc: 0-No pain                 Hendrixx Severin A

## 2019-05-19 NOTE — Transfer of Care (Signed)
Immediate Anesthesia Transfer of Care Note  Patient: Zurisaday Bult Lessner  Procedure(s) Performed: COLONOSCOPY WITH PROPOFOL (N/A ) POLYPECTOMY  Patient Location: PACU  Anesthesia Type:General  Level of Consciousness: awake, alert , oriented and patient cooperative  Airway & Oxygen Therapy: Patient Spontanous Breathing  Post-op Assessment: Report given to RN and Post -op Vital signs reviewed and stable  Post vital signs: Reviewed and stable  Last Vitals:  Vitals Value Taken Time  BP 110/70 05/19/19 1315  Temp    Pulse    Resp 16 05/19/19 1317  SpO2    Vitals shown include unvalidated device data.  Last Pain:  Vitals:   05/19/19 1204  TempSrc: Oral  PainSc: 0-No pain      Patients Stated Pain Goal: 5 ([id number])  Complications: No apparent anesthesia complications

## 2019-05-19 NOTE — Anesthesia Procedure Notes (Signed)
Procedure Name: General with mask airway Date/Time: 05/19/2019 12:33 PM Performed by: Andree Elk, Amy A, CRNA Pre-anesthesia Checklist: Timeout performed, Patient being monitored, Suction available, Emergency Drugs available and Patient identified Oxygen Delivery Method: Non-rebreather mask

## 2019-05-20 ENCOUNTER — Telehealth: Payer: Self-pay | Admitting: Gastroenterology

## 2019-05-20 NOTE — Telephone Encounter (Signed)
PLEASE CALL PT. SHE HAD FIVE SIMPLE ADENOMAS REMOVED.    DRINK WATER TO KEEP YOUR URINE LIGHT YELLOW. CONTINUE YOUR WEIGHT LOSS EFFORTS. FOLLOW A HIGH FIBER DIET. AVOID ITEMS THAT CAUSE BLOATING.  USE PREPARATION H FOUR TIMES  A DAY IF NEEDED TO RELIEVE RECTAL PAIN/PRESSURE/BLEEDING.  YOU CAN SEE SURGERY TO FIX YOUR HEMORRHOIDS IF YOUR SYMPTOMS CANNOT BE CONTROLLED WITH CREAMS OF SUPPOSITORIES.  Next colonoscopy in 3 years

## 2019-05-20 NOTE — Telephone Encounter (Signed)
PT is aware of results.  

## 2019-05-21 ENCOUNTER — Encounter (HOSPITAL_COMMUNITY): Payer: Self-pay | Admitting: Gastroenterology

## 2019-05-21 NOTE — Telephone Encounter (Signed)
ON RECALL  °

## 2019-06-24 ENCOUNTER — Ambulatory Visit: Payer: BC Managed Care – PPO | Admitting: Orthopedic Surgery

## 2019-06-24 ENCOUNTER — Other Ambulatory Visit: Payer: Self-pay

## 2019-06-24 ENCOUNTER — Ambulatory Visit: Payer: BC Managed Care – PPO

## 2019-06-24 ENCOUNTER — Other Ambulatory Visit: Payer: Self-pay | Admitting: Radiology

## 2019-06-24 ENCOUNTER — Encounter: Payer: Self-pay | Admitting: Orthopedic Surgery

## 2019-06-24 VITALS — BP 131/70 | HR 79 | Ht 65.0 in | Wt 235.0 lb

## 2019-06-24 DIAGNOSIS — M25522 Pain in left elbow: Secondary | ICD-10-CM

## 2019-06-24 DIAGNOSIS — M7022 Olecranon bursitis, left elbow: Secondary | ICD-10-CM | POA: Diagnosis not present

## 2019-06-24 NOTE — Patient Instructions (Addendum)
You have received an injection of steroids into the joint. 15% of patients will have increased pain within the 24 hours postinjection.   This is transient and will go away.   We recommend that you use ice packs on the injection site for 20 minutes every 2 hours and extra strength Tylenol 2 tablets every 8 as needed until the pain resolves.  If you continue to have pain after taking the Tylenol and using the ice please call the office for further instructions.    Elbow Bursitis  Bursitis is swelling and pain at the tip of the elbow. This happens when fluid builds up in a sac under the skin (bursa). This may also be called olecranon bursitis. What are the causes? Elbow bursitis may be caused by:  Elbow injury, such as falling onto the elbow.  Leaning on hard surfaces for long periods of time.  Infection from an injury that breaks the skin near the elbow.  A bone growth (spur) that forms at the tip of the elbow.  A medical condition that causes inflammation, such as gout or rheumatoid arthritis. Sometimes the cause is not known. What are the signs or symptoms? The first sign of elbow bursitis is usually swelling at the tip of the elbow. This can grow to be about the size of a golf ball. Swelling may start suddenly or develop gradually. Other symptoms may include:  Pain when bending or leaning on the elbow.  Not being able to move the elbow normally. If bursitis is caused by an infection, you may have:  Redness, warmth, and tenderness of the elbow.  Drainage of pus from the swollen area over the elbow, if the skin breaks open. How is this diagnosed? This condition may be diagnosed based on:  Your symptoms and medical history.  Any recent injuries you have had.  A physical exam.  X-rays to check for a bone spur or fracture.  Draining fluid from the bursa to test it for infection.  Blood tests to rule out gout or rheumatoid arthritis. How is this treated? Treatment for  elbow bursitis depends on the cause. Treatment may include:  Medicines. These may include: ? Over-the-counter medicines to relieve pain and inflammation. ? Antibiotic medicines. ? Injections of anti-inflammatory medicines (steroids).  Draining fluid from the bursa.  Wrapping your elbow with a bandage.  Wearing elbow pads. If these treatments do not help, you may need surgery to remove the bursa. Follow these instructions at home: Medicines  Take over-the-counter and prescription medicines only as told by your health care provider.  If you were prescribed an antibiotic medicine, take it as told by your health care provider. Do not stop taking the antibiotic even if you start to feel better. Managing pain, stiffness, and swelling   If directed, put ice on your elbow: ? Put ice in a plastic bag. ? Place a towel between your skin and the bag. ? Leave the ice on for 20 minutes, 2-3 times a day.  If your bursitis is caused by an injury, rest your elbow and wear your bandage as told by your health care provider.  Use elbow pads or elbow wraps to cushion your elbow as needed. General instructions  Avoid any activities that cause elbow pain. Ask your health care provider what activities are safe for you.  Keep all follow-up visits as told by your health care provider. This is important. Contact a health care provider if you have:  A fever.  Symptoms that do  not get better with treatment.  Pain or swelling that: ? Gets worse. ? Goes away and then comes back.  Pus draining from your elbow. Get help right away if you have:  Trouble moving your arm, hand, or fingers. Summary  Elbow bursitis is inflammation of the fluid-filled sac (bursa) between the tip of your elbow bone (olecranon) and your skin.  Treatment for elbow bursitis depends on the cause. It may include medicines to relieve pain and inflammation, antibiotic medicines, and draining fluid from your elbow.  Contact a  health care provider if your symptoms do not get better with treatment, or if your symptoms go away and then come back. This information is not intended to replace advice given to you by your health care provider. Make sure you discuss any questions you have with your health care provider. Document Released: 10/10/2006 Document Revised: 08/23/2017 Document Reviewed: 08/20/2017 Elsevier Patient Education  2020 [employer].

## 2019-06-24 NOTE — Progress Notes (Signed)
Elizabeth Mercado  06/24/2019  HISTORY SECTION :  Chief Complaint  Patient presents with  . Elbow Problem    left elbow swelling    Elizabeth Mercado is 58 years old she comes in with really no pain but swelling over her left elbow for a couple of weeks associated with swelling and mass no history of trauma but she does report that she tends to lean on her elbows a lot when sitting   Review of Systems  HENT: Positive for tinnitus.   Cardiovascular: Positive for leg swelling.  Musculoskeletal: Positive for myalgias.  Neurological: Positive for tingling.  All other systems reviewed and are negative.    has a past medical history of CRPS (complex regional pain syndrome type I), Depression, and Warts, genital.   Past Surgical History:  Procedure Laterality Date  . CATARACT EXTRACTION    . CESAREAN SECTION    . COLONOSCOPY WITH PROPOFOL N/A 05/19/2019   Procedure: COLONOSCOPY WITH PROPOFOL;  Surgeon: Danie Binder, MD;  Location: [facility];  Service: Endoscopy;  Laterality: N/A;  1:15pm-pt can't come any earlier  . POLYPECTOMY  05/19/2019   Procedure: POLYPECTOMY;  Surgeon: Danie Binder, MD;  Location: [facility];  Service: Endoscopy;;    Body mass index is 39.11 kg/m.   Allergies  Allergen Reactions  . Codeine Nausea Only     Current Outpatient Medications:  .  DULoxetine (CYMBALTA) 60 MG capsule, Take 60 mg by mouth 2 (two) times daily. , Disp: , Rfl:  .  gabapentin (NEURONTIN) 600 MG tablet, Take 600 mg by mouth 2 (two) times daily. , Disp: , Rfl:  .  ibuprofen (ADVIL) 200 MG tablet, Take 400 mg by mouth every 6 (six) hours as needed for headache or moderate pain., Disp: , Rfl:    PHYSICAL EXAM SECTION: 1) BP 131/70   Pulse 79   Ht 5\' 5"  (1.651 m)   Wt 235 lb (106.6 kg)   BMI 39.11 kg/m   Body mass index is 39.11 kg/m. General appearance: Well-developed well-nourished no gross deformities  2) Cardiovascular normal pulse and perfusion in the UPPER extremities  normal color without edema  3) Neurologically deep tendon reflexes are equal and normal, no sensation loss or deficits no pathologic reflexes  4) Psychological: Awake alert and oriented x3 mood and affect normal  5) Skin no lacerations or ulcerations no nodularity no palpable masses, no erythema or nodularity  6) Musculoskeletal:   Right elbow no mass no tenderness full range of motion  Left elbow small bursal mass nontender slightly fluid-filled normal range of motion of the elbow elbow feels stable strength is normal skin is not red or erythematous  MEDICAL DECISION SECTION:  Encounter Diagnoses  Name Primary?  . Pain in left elbow Yes  . Olecranon bursitis, left elbow     Imaging X-ray was negative except for soft tissue swelling see report  Plan:  (Rx., Inj., surg., Frx, MRI/CT, XR:2)  Aspiration injection left elbow  We aspirated 1 cc of fluid out it was clear it was yellow it was nonpurulent  Patient gave consent patient agreed to the injection timeout was completed for left elbow aspiration injection  We cleaned with alcohol injected after anesthetizing with ethyl chloride we took back 1 cc of clear yellow fluid we injected 1 cc of lidocaine Depo-Medrol mixture  Ace bandage applied patient advised to come back if it returns  3:26 PM Arther Abbott, MD  06/24/2019

## 2019-11-19 ENCOUNTER — Ambulatory Visit: Payer: BC Managed Care – PPO | Admitting: Gastroenterology

## 2021-02-28 ENCOUNTER — Other Ambulatory Visit (HOSPITAL_COMMUNITY): Payer: Self-pay | Admitting: Family Medicine

## 2021-02-28 DIAGNOSIS — Z1231 Encounter for screening mammogram for malignant neoplasm of breast: Secondary | ICD-10-CM

## 2022-04-05 ENCOUNTER — Encounter: Payer: Self-pay | Admitting: *Deleted

## 2022-04-25 ENCOUNTER — Other Ambulatory Visit (HOSPITAL_COMMUNITY): Payer: Self-pay | Admitting: Family Medicine

## 2022-04-25 DIAGNOSIS — Z1231 Encounter for screening mammogram for malignant neoplasm of breast: Secondary | ICD-10-CM

## 2022-05-16 ENCOUNTER — Other Ambulatory Visit (HOSPITAL_COMMUNITY): Payer: Self-pay | Admitting: Family Medicine

## 2022-05-16 ENCOUNTER — Ambulatory Visit (HOSPITAL_COMMUNITY)
Admission: RE | Admit: 2022-05-16 | Discharge: 2022-05-16 | Disposition: A | Discharge status: Home / Self Care | Payer: Medicaid Other | Source: Ambulatory Visit | Attending: Family Medicine | Admitting: Family Medicine

## 2022-05-16 DIAGNOSIS — F17210 Nicotine dependence, cigarettes, uncomplicated: Secondary | ICD-10-CM | POA: Diagnosis not present

## 2022-05-16 DIAGNOSIS — Z1231 Encounter for screening mammogram for malignant neoplasm of breast: Secondary | ICD-10-CM | POA: Insufficient documentation

## 2022-06-20 ENCOUNTER — Ambulatory Visit: Payer: Medicaid Other | Admitting: Adult Health

## 2022-08-06 ENCOUNTER — Encounter: Payer: Self-pay | Admitting: Adult Health

## 2022-08-06 ENCOUNTER — Ambulatory Visit (INDEPENDENT_AMBULATORY_CARE_PROVIDER_SITE_OTHER): Payer: Medicaid Other | Admitting: Adult Health

## 2022-08-06 ENCOUNTER — Other Ambulatory Visit (HOSPITAL_COMMUNITY)
Admission: RE | Admit: 2022-08-06 | Discharge: 2022-08-06 | Disposition: A | Discharge status: Home / Self Care | Payer: Medicaid Other | Source: Ambulatory Visit | Attending: Adult Health | Admitting: Adult Health

## 2022-08-06 VITALS — BP 128/74 | HR 86 | Ht 65.0 in | Wt 229.2 lb

## 2022-08-06 DIAGNOSIS — Z01419 Encounter for gynecological examination (general) (routine) without abnormal findings: Secondary | ICD-10-CM | POA: Insufficient documentation

## 2022-08-06 NOTE — Progress Notes (Signed)
Patient ID: CELENIA HRUSKA, female   DOB: September 02, 1961, 61 y.o.   MRN: [id number] History of Present Illness: Sarah is a 61 year old white female, divorced, PM in for a well woman gyn exam and pap. She had physical with PCP.   PCP is [facility].  Current Medications, Allergies, Past Medical History, Past Surgical History, Family History and Social History were reviewed in [employer] record.     Review of Systems: Patient denies any headaches, hearing loss, fatigue, blurred vision, shortness of breath, chest pain, abdominal pain, problems with bowel movements, urination, or intercourse.(Not active). No joint pain or mood swings.  She denies any vaginal bleeding.    Physical Exam:BP 128/74 (BP Location: Right Arm, Patient Position: Sitting, Cuff Size: Normal)   Pulse 86   Ht '5\' 5"'$  (1.651 m)   Wt 229 lb 4 oz (104 kg)   BMI 38.15 kg/m   General:  Well developed, well nourished, no acute distress Skin:  Warm and dry Neck:  Midline trachea, normal thyroid, good ROM, no lymphadenopathy,no carotid bruits heard  Lungs; Clear to auscultation bilaterally Breast:  No dominant palpable mass, retraction, or nipple discharge Cardiovascular: Regular rate and rhythm Abdomen:  Soft, non tender, no hepatosplenomegaly Pelvic:  External genitalia is normal in appearance, no lesions.  The vagina is normal in appearance. Urethra has no lesions or masses. The cervix is smooth,pap with HR HPV genotyping performed.  Uterus is felt to be normal size, shape, and contour.  No adnexal masses or tenderness noted.Bladder is non tender, no masses felt. Rectal: pt declined exam. Extremities/musculoskeletal:  No swelling or varicosities noted, no clubbing or cyanosis Psych:  No mood changes, alert and cooperative,seems happy AA is 3 Fall risk is low    08/06/2022    1:42 PM 03/02/2019    3:35 PM  Depression screen PHQ 2/9  Decreased Interest 0 0  Down, Depressed, Hopeless 0 0  PHQ - 2  Score 0 0  Altered sleeping 1   Tired, decreased energy 1   Change in appetite 1   Feeling bad or failure about yourself  0   Trouble concentrating 0   Moving slowly or fidgety/restless 0   Suicidal thoughts 0   PHQ-9 Score 3    She is on meds from PCP    08/06/2022    1:42 PM  GAD 7 : Generalized Anxiety Score  Nervous, Anxious, on Edge 0  Control/stop worrying 0  Worry too much - different things 1  Trouble relaxing 0  Restless 0  Easily annoyed or irritable 1  Afraid - awful might happen 0  Total GAD 7 Score 2    Upstream - 08/06/22 1340       Pregnancy Intention Screening   Does the patient want to become pregnant in the next year? N/A    Does the patient's partner want to become pregnant in the next year? N/A    Would the patient like to discuss contraceptive options today? N/A      Contraception Wrap Up   Current Method No Method - Other Reason   post menopausal   End Method No Method - Other Reason   post menopausal   Contraception Counseling Provided No              Examination chaperoned by Dewitt Hoes RN  Impression and Plan: 1. Encounter for gynecological examination with Papanicolaou smear of cervix Pap sent Pap in 3 years if normal Physical with PCP Colonoscopy  per GI Had negative mammogram 05/16/22 Labs with PCP

## 2022-08-06 NOTE — Addendum Note (Signed)
Addended by: Jesusita Oka on: 08/06/2022 02:32 PM   Modules accepted: Orders

## 2022-08-09 LAB — CYTOLOGY - PAP
Comment: NEGATIVE
Diagnosis: NEGATIVE
High risk HPV: NEGATIVE

## 2022-08-10 ENCOUNTER — Telehealth: Payer: Self-pay

## 2022-08-10 NOTE — Telephone Encounter (Signed)
Notified patient pap was negative for malignancy and HPV, did have yeast on pap, if any itching can use OTC monistat. Patient verbalized understanding.

## 2022-08-10 NOTE — Telephone Encounter (Signed)
-----   Message from Estill Dooms, NP sent at 08/09/2022  1:47 PM EST ----- Call pt please and let her know about pap. Elizabeth Sauer

## 2022-08-10 NOTE — Telephone Encounter (Signed)
Left message on voicemail for patient to return my call for results.

## 2023-07-30 ENCOUNTER — Other Ambulatory Visit (HOSPITAL_COMMUNITY): Payer: Self-pay | Admitting: Radiology

## 2023-07-30 DIAGNOSIS — R0609 Other forms of dyspnea: Secondary | ICD-10-CM

## 2023-08-07 ENCOUNTER — Encounter (HOSPITAL_COMMUNITY): Payer: Medicaid Other

## 2023-09-12 ENCOUNTER — Ambulatory Visit (HOSPITAL_COMMUNITY): Admission: RE | Admit: 2023-09-12 | Payer: Medicaid Other | Source: Ambulatory Visit

## 2023-10-22 ENCOUNTER — Ambulatory Visit (HOSPITAL_COMMUNITY)
Admission: RE | Admit: 2023-10-22 | Discharge: 2023-10-22 | Disposition: A | Discharge status: Home / Self Care | Payer: Medicaid Other | Source: Ambulatory Visit | Attending: Adult Health | Admitting: Adult Health
